# Patient Record
Sex: Female | Born: 1945 | Race: White | Hispanic: No | Smoking: Never smoker
Health system: Southern US, Community
[De-identification: ages and names within clinical notes are randomized; demographics above are authoritative.]

## PROBLEM LIST (undated history)

## (undated) DIAGNOSIS — I10 Essential (primary) hypertension: Secondary | ICD-10-CM

## (undated) HISTORY — PX: CHOLECYSTECTOMY: SHX55

## (undated) HISTORY — DX: Essential (primary) hypertension: I10

## (undated) HISTORY — PX: MENISCUS REPAIR: SHX5179

## (undated) HISTORY — PX: ACHILLES TENDON REPAIR: SUR1153

## (undated) HISTORY — PX: ABDOMINAL HYSTERECTOMY: SHX81

## (undated) HISTORY — PX: APPENDECTOMY: SHX54

## (undated) HISTORY — PX: ANTERIOR CERVICAL DISCECTOMY: SHX1160

---

## 2008-12-01 ENCOUNTER — Inpatient Hospital Stay (HOSPITAL_COMMUNITY): Admission: RE | Admit: 2008-12-01 | Discharge: 2008-12-02 | Payer: Self-pay | Admitting: Neurosurgery

## 2010-05-20 IMAGING — RF DG CERVICAL SPINE 2 OR 3 VIEWS
1 series · 2 of 2 positions shown · non-contrast
Comparison: None

CLINICAL DATA: Cervical fusion

CERVICAL SPINE - 2-3 VIEW

[Series 1: run · 2 of 2 slices shown]
[im 1/2]
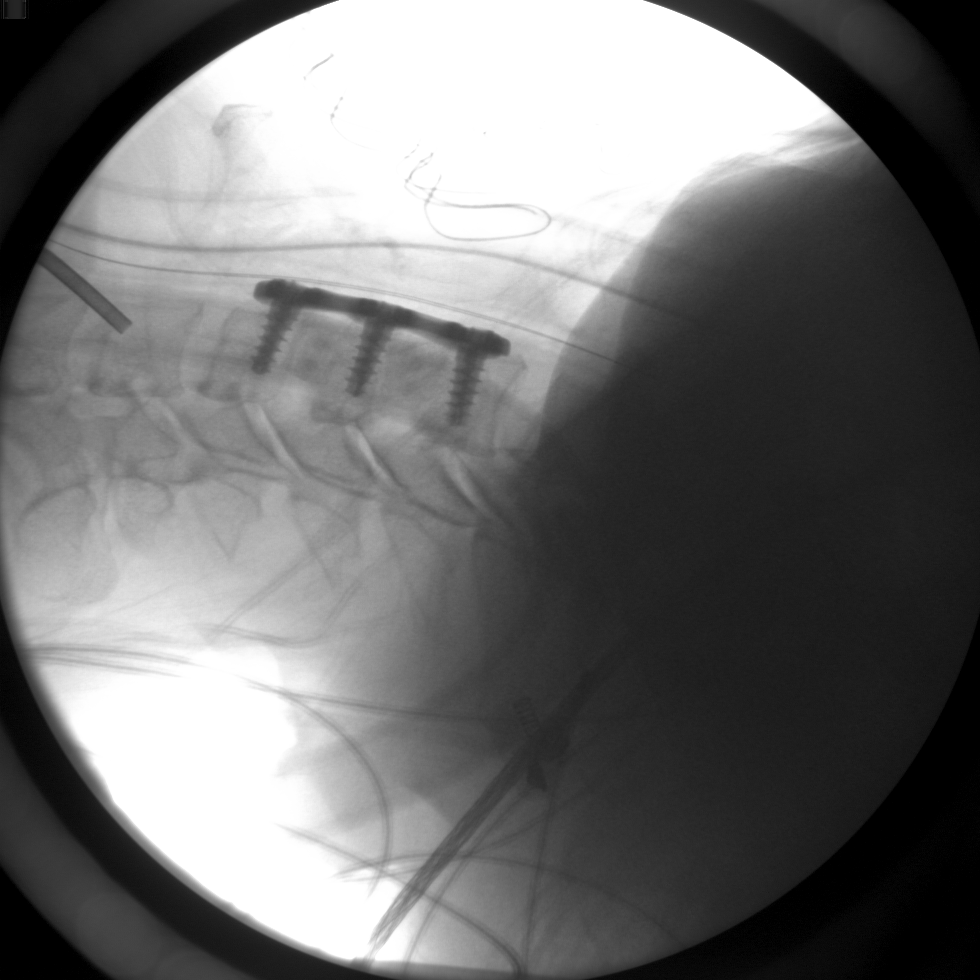
[im 2/2]
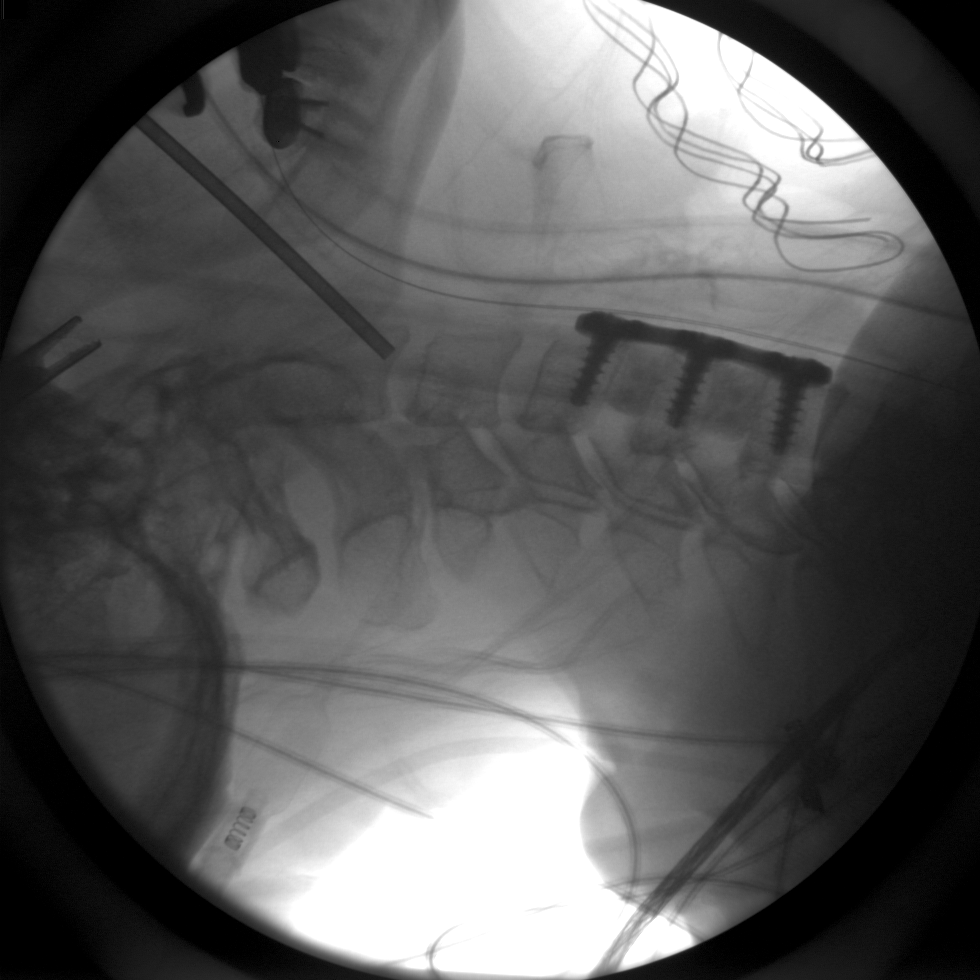

[2 of 2 positions shown; findings below may reference images not displayed]

FINDINGS: Anterior plates and screws are seen in C4-C5 and C6 with
interposed tubular bone plugs.  Anatomic alignment.  No hardware
failure.  No fracture.
IMPRESSION: Anterior discectomy and fusion at C4, C5 and C6.

## 2010-10-24 LAB — BASIC METABOLIC PANEL
BUN: 7 mg/dL (ref 6–23)
CO2: 32 mEq/L (ref 19–32)
Calcium: 9.9 mg/dL (ref 8.4–10.5)
Chloride: 105 mEq/L (ref 96–112)
Creatinine, Ser: 0.62 mg/dL (ref 0.4–1.2)
Glucose, Bld: 86 mg/dL (ref 70–99)

## 2010-10-24 LAB — CBC
MCHC: 34.2 g/dL (ref 30.0–36.0)
MCV: 89.1 fL (ref 78.0–100.0)
Platelets: 251 10*3/uL (ref 150–400)
RDW: 12.6 % (ref 11.5–15.5)

## 2010-11-28 NOTE — Op Note (Signed)
NAMECarolle, Ishii Jleigh                   ACCOUNT NO.:  [id number]   MEDICAL RECORD NO.:  [id number]          PATIENT TYPE:  INP   LOCATION:  3524                         FACILITY:  [facility]   PHYSICIAN:  Donalee Citrin, M.D.        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   PROCEDURE:  Anterior cervical diskectomies and fusion at C4-C5 and C5-C6  using 6-mm allograft wedge at C4-C5, 7-mm allograft at C5-C6 and a 40-mm  Venture plate with six 16-XW variable angle screws.   SURGEON:  Donalee Citrin, MD   ASSISTANT:  Tia Alert, MD.   ANESTHESIA:  General endotracheal.   HISTORY OF PRESENT ILLNESS:  The patient is a very pleasant 65 year old  female who has had progressive worsening neck pain, bilateral arm pain  worse in the right shoulder and right arm with numbness and tingling in  her fingers.  The patient's MRI scan showed severe cervical stenosis and  spondylosis at C4-C5 and C5-C6 with spinal cord compression and  biforaminal stenosis.  The patient failed all forms of conservative  treatment and due to MRI scan, clinical exam and the failure of  conservative treatment, the patient was recommended two-level anterior  cervical discectomy and fusion.  Risks and benefits of the operation  were discussed; the patient understood and agreed to proceed forward.   DESCRIPTION OF PROCEDURE:  The patient was brought to the OR, was  induced under general anesthesia, positioned supine, with then neck in  slight extension and 5 pounds of halter traction.  The right side of her  neck was prepped and draped in usual sterile fashion.  Preoperative x-  ray localized the appropriate level so a curvilinear incision was made  just off the midline to the anterior border of the sternocleidomastoid.  The superficial layer of  the platysma was dissected out and divided  longitudinally.  The avascular plane between the sternocleidomastoid and  strap muscle was developed down to the prevertebral fascia.  Prevertebral fascia then dissected with Kittners.  The intraoperative x-  ray confirmed localization of C5-C6 disk space so longus colli was  reflected laterally and then self-retaining retractors were placed on  this disk space and the one above it.  Annulotomy was then extended at  both levels.  Large anterior osteophytes were bitten of the C4-C5  vertebral bodies respectively and both disk spaces were drilled down the  posterior annulus and osteophytic complexes.  At this point, the  operating microscope was draped and brought into field.  Under  microscope illumination first working at C4-C5 posterior annulus and  osteophyte was bitten off, then a 1-2 mm Kerrison punch was used to  begin the aggressive under biting of both C4-C5 vertebral bodies  decompressing the central canal.  The PLL was then removed in piecemeal  fashion, marching across to both C5 pedicles and both C5 neural foramina  were unroofed,  especially on the right.  The right sided C5 nerve root  was skeletonized, flushed with a pedicle and confirmed adequate  decompression by easily accepting a nerve hook out the foramen.  After  confirmation of aggressive central decompression with under biting of  both C4-C5 vertebral bodies, meticulous hemostasis was maintained at  this level, was copiously irrigated, endplates were scraped and a size 6  graft was then inserted at that level.  Then working at C5-C6 the  interspace in a similar fashion was drilled down the posterior  osteophytes.  A very large osteophyte was aggressively under-bitten at  C5 vertebral body decompressing the central canal, marching across  laterally into the C6 pedicle.  The C6 nerve roots were both identified  bilaterally, unroofed, skeletonized and flushed with pedicle.  At  the  end of decompression, both foramina easily accepted the nerve hook.  The  central canal was decompressed by aggressive under-biting of both  vertebral bodies.  Endplates were scraped.  A size 7-mm graft was  inserted at C5-C6.  Then a 40-mm Venture plate was placed.  All screws  had excellent purchase with locking mechanism engaged.  Wound was then  copiously irrigated and meticulous hemostasis was maintained.  The wound  was closed in layers with interrupted Vicryl in the platysma and a  running 4-0 subcuticular.  Benzoin and Steri-Strips were applied.  The  patient went to recovery room in stable condition.  At the end of the  case, needle count and sponge counts were correct.           ______________________________  Donalee Citrin, M.D.     GC/MEDQ  D:  12/01/2008  T:  12/02/2008  Job:  [id number]

## 2022-06-23 ENCOUNTER — Encounter: Payer: Self-pay | Admitting: Emergency Medicine

## 2022-06-23 ENCOUNTER — Ambulatory Visit (INDEPENDENT_AMBULATORY_CARE_PROVIDER_SITE_OTHER): Payer: Medicare PPO

## 2022-06-23 ENCOUNTER — Ambulatory Visit
Admission: EM | Admit: 2022-06-23 | Discharge: 2022-06-23 | Disposition: A | Discharge status: Home / Self Care | Payer: Medicare PPO | Attending: Family Medicine | Admitting: Family Medicine

## 2022-06-23 DIAGNOSIS — R042 Hemoptysis: Secondary | ICD-10-CM | POA: Diagnosis not present

## 2022-06-23 DIAGNOSIS — J209 Acute bronchitis, unspecified: Secondary | ICD-10-CM | POA: Diagnosis not present

## 2022-06-23 MED ORDER — AMOXICILLIN-POT CLAVULANATE 875-125 MG PO TABS: 1.0000 | ORAL_TABLET | Freq: Two times a day (BID) | ORAL | 0 refills | Status: AC

## 2022-06-23 NOTE — ED Provider Notes (Addendum)
[facility]    CSN: [id number] Arrival date & time: 06/23/22  1149      History   Chief Complaint Chief Complaint  Patient presents with   Cough    HPI Penny Stout is a 76 y.o. female.   HPI Patient states she has had a cough cold and runny nose for about a week.  Her granddaughter is sick as well, brought in infection home from daycare.  She states that she has started to feel some chest tightness.  Cough is productive.  This morning when she coughed she coughed up some sputum with blood streaks in it.  She does have a history of underlying asthma as a youngster  Past Medical History:  Diagnosis Date   Hypertension     There are no problems to display for this patient.   Past Surgical History:  Procedure Laterality Date   ABDOMINAL HYSTERECTOMY     ACHILLES TENDON REPAIR Bilateral    ANTERIOR CERVICAL DISCECTOMY     APPENDECTOMY     CHOLECYSTECTOMY     MENISCUS REPAIR Right     OB History   No obstetric history on file.      Home Medications    Prior to Admission medications   Medication Sig Start Date End Date Taking? Authorizing Provider  amoxicillin-clavulanate (AUGMENTIN) 875-125 MG tablet Take 1 tablet by mouth every 12 (twelve) hours. 06/23/22  Yes Eustace Moore, MD  escitalopram (LEXAPRO) 10 MG tablet Take by mouth. 12/03/16  Yes [provider]  fluticasone (FLONASE) 50 MCG/ACT nasal spray SHAKE LIQUID AND USE 1 SPRAY IN EACH NOSTRIL DAILY 03/26/16  Yes [provider]  metoprolol succinate (TOPROL-XL) 25 MG 24 hr tablet Take by mouth. 06/11/22  Yes [provider]  omeprazole (PRILOSEC) 40 MG capsule Take by mouth. 04/11/22  Yes [provider]  calcium citrate (CALCITRATE - DOSED IN MG ELEMENTAL CALCIUM) 950 (200 Ca) MG tablet Take 1 tablet by mouth 2 (two) times daily.    [provider]  cyanocobalamin (VITAMIN B12) 500 MCG tablet Take by mouth.    [provider]  fexofenadine  (ALLEGRA) 180 MG tablet Take by mouth.    [provider]  Multiple Vitamin (DAILY VITAMINS) tablet Take 1 tablet by mouth daily.    [provider]  vitamin E 180 MG (400 UNITS) capsule Take by mouth.    [provider]    Family History Family History  Problem Relation Age of Onset   Transient ischemic attack Mother    Dementia Mother    Brain cancer Father    Ovarian cancer Sister     Social History Social History   Tobacco Use   Smoking status: Never    Passive exposure: Never   Smokeless tobacco: Never  Substance Use Topics   Alcohol use: Not Currently   Drug use: Never     Allergies   Asa [aspirin]   Review of Systems Review of Systems See HPI  Physical Exam Triage Vital Signs ED Triage Vitals  Enc Vitals Group     BP 06/23/22 1208 133/81     Pulse Rate 06/23/22 1208 72     Resp 06/23/22 1208 18     Temp 06/23/22 1208 98.7 F (37.1 C)     Temp Source 06/23/22 1208 Tympanic     SpO2 06/23/22 1208 100 %     Weight 06/23/22 1211 160 lb (72.6 kg)     Height 06/23/22 1211 5' 5.5" (  1.664 m)     Head Circumference --      Peak Flow --      Pain Score 06/23/22 1210 4     Pain Loc --      Pain Edu? --      Excl. in GC? --    No data found.  Updated Vital Signs BP 133/81 (BP Location: Left Arm)   Pulse 72   Temp 98.7 F (37.1 C) (Tympanic) Comment: pt drank cold water  Resp 18   Ht 5' 5.5" (1.664 m)   Wt 72.6 kg   SpO2 100%   BMI 26.22 kg/m       Physical Exam Constitutional:      General: She is not in acute distress.    Appearance: She is well-developed.  HENT:     Head: Normocephalic and atraumatic.     Right Ear: Tympanic membrane and ear canal normal.     Left Ear: Tympanic membrane and ear canal normal.     Nose: No congestion.     Mouth/Throat:     Pharynx: Posterior oropharyngeal erythema present.  Eyes:     Conjunctiva/sclera: Conjunctivae normal.     Pupils: Pupils are equal, round, and reactive to  light.  Cardiovascular:     Rate and Rhythm: Normal rate.  Pulmonary:     Effort: Pulmonary effort is normal. No respiratory distress.     Breath sounds: No wheezing or rhonchi.  Abdominal:     General: There is no distension.     Palpations: Abdomen is soft.  Musculoskeletal:        General: Normal range of motion.     Cervical back: Normal range of motion.  Skin:    General: Skin is warm and dry.  Neurological:     Mental Status: She is alert.      UC Treatments / Results  Labs (all labs ordered are listed, but only abnormal results are displayed) Labs Reviewed - No data to display  EKG   Radiology DG Chest 2 View  Result Date: 06/23/2022 CLINICAL DATA:  Hemoptysis EXAM: CHEST - 2 VIEW COMPARISON:  None Available. FINDINGS: Heart size appears normal. No pleural effusion or edema. No airspace opacities identified. Visualized osseous structures are unremarkable. IMPRESSION: No active cardiopulmonary abnormalities. Electronically Signed   By: Signa Kell M.D.   On: 06/23/2022 12:55    Procedures Procedures (including critical care time)  Medications Ordered in UC Medications - No data to display  Initial Impression / Assessment and Plan / UC Course  I have reviewed the triage vital signs and the nursing notes.  Pertinent labs & imaging results that were available during my care of the patient were reviewed by me and considered in my medical decision making (see chart for details).     Patient was treated with Augmentin for respiratory infection with hemoptysis Final Clinical Impressions(s) / UC Diagnoses   Final diagnoses:  Acute bronchitis, unspecified organism  Hemoptysis     Discharge Instructions      Drink lots of fluids Take Mucinex DM every 12 hours as needed for cough and congestion Take Augmentin 2 times a day.  This is an antibiotic Your doctor if not improving by next week      ED Prescriptions     Medication Sig Dispense Auth.  Provider   amoxicillin-clavulanate (AUGMENTIN) 875-125 MG tablet Take 1 tablet by mouth every 12 (twelve) hours. 14 tablet Eustace Moore, MD  PDMP not reviewed this encounter.   Eustace Moore, MD 06/23/22 1306    Eustace Moore, MD 06/23/22 1315

## 2022-06-23 NOTE — ED Triage Notes (Addendum)
Sore throat last Sunday  Lost her voice  on Monday & Tuesday  Nasal congestion x 4 days  Cough started last night w/ blood tinged drainage Delsym daily Granddaughter was also sick - COVID/Flu//RSV  were negative

## 2022-06-23 NOTE — Discharge Instructions (Signed)
Drink lots of fluids Take Mucinex DM every 12 hours as needed for cough and congestion Take Augmentin 2 times a day.  This is an antibiotic Your doctor if not improving by next week

## 2022-06-24 ENCOUNTER — Telehealth: Payer: Self-pay | Admitting: Emergency Medicine

## 2022-06-24 ENCOUNTER — Ambulatory Visit: Payer: Self-pay

## 2022-06-24 NOTE — Telephone Encounter (Signed)
Spoke with patient states that she has started her antibiotics and her chest isn't as tight today.  Patient was able to rest last night, less blood in cough.  Patient will follow up as needed.
